# Patient Record
Sex: Female | Born: 1960 | Race: Black or African American | Hispanic: No | Marital: Single | Smoking: Current every day smoker
Health system: Southern US, Community
[De-identification: ages and names within clinical notes are randomized; demographics above are authoritative.]

## PROBLEM LIST (undated history)

## (undated) DIAGNOSIS — D249 Benign neoplasm of unspecified breast: Secondary | ICD-10-CM

## (undated) DIAGNOSIS — N63 Unspecified lump in unspecified breast: Secondary | ICD-10-CM

## (undated) DIAGNOSIS — J189 Pneumonia, unspecified organism: Secondary | ICD-10-CM

## (undated) HISTORY — DX: Unspecified lump in unspecified breast: N63.0

## (undated) HISTORY — DX: Benign neoplasm of unspecified breast: D24.9

## (undated) HISTORY — DX: Pneumonia, unspecified organism: J18.9

---
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
## 2011-02-09 DIAGNOSIS — N63 Unspecified lump in unspecified breast: Secondary | ICD-10-CM

## 2011-02-09 HISTORY — DX: Unspecified lump in unspecified breast: N63.0

## 2011-02-09 HISTORY — PX: BREAST SURGERY: SHX581

## 2012-01-13 ENCOUNTER — Ambulatory Visit: Payer: Self-pay

## 2012-01-24 ENCOUNTER — Ambulatory Visit: Payer: Self-pay

## 2012-02-07 ENCOUNTER — Ambulatory Visit: Payer: Self-pay | Admitting: General Surgery

## 2012-08-02 ENCOUNTER — Encounter: Payer: Self-pay | Admitting: *Deleted

## 2013-01-16 ENCOUNTER — Ambulatory Visit: Payer: Self-pay | Admitting: *Deleted

## 2013-01-18 ENCOUNTER — Telehealth: Payer: Self-pay

## 2013-01-18 NOTE — Telephone Encounter (Signed)
Spoke with patient and she is having her screening mammogram scheduled through her work on 01/18/13 @ [facility]. She is not sure if she will be able to follow up here with Dr Evette Cristal afterwards due to financial constraints. She will call us to see about following up after she completes her mammogram.

## 2013-07-03 IMAGING — US ULTRASOUND LEFT BREAST
1 series · 13 of 21 positions shown · non-contrast
Comparison: none

REASON FOR EXAM: us lt density Susanth Oh [id number]
COMMENTS:

[Series 1: ultrasound left breast · 0.08mm/px · 13 of 21 slices shown]
[im 1/21]
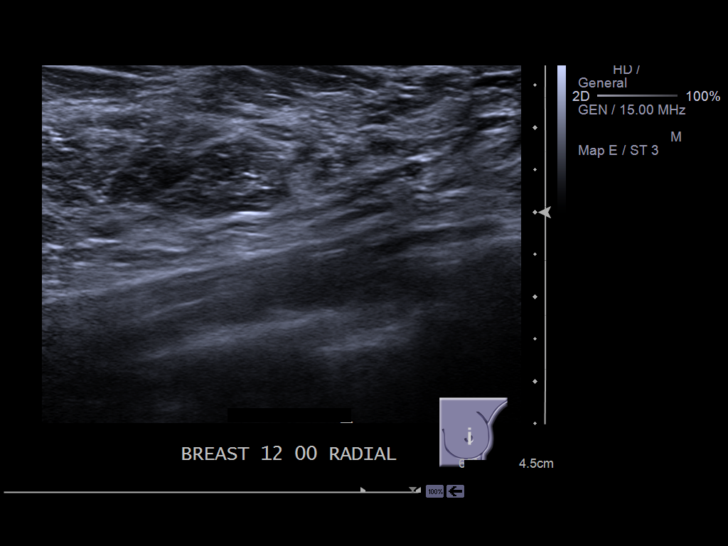
[im 3/21]
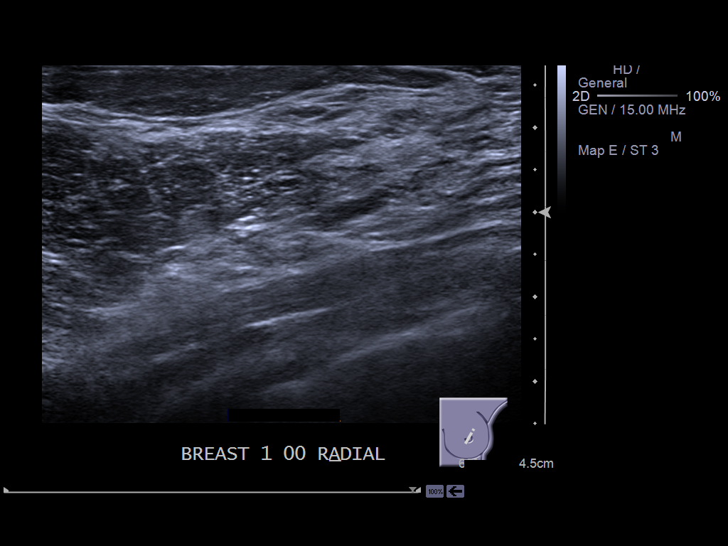
[im 5/21]
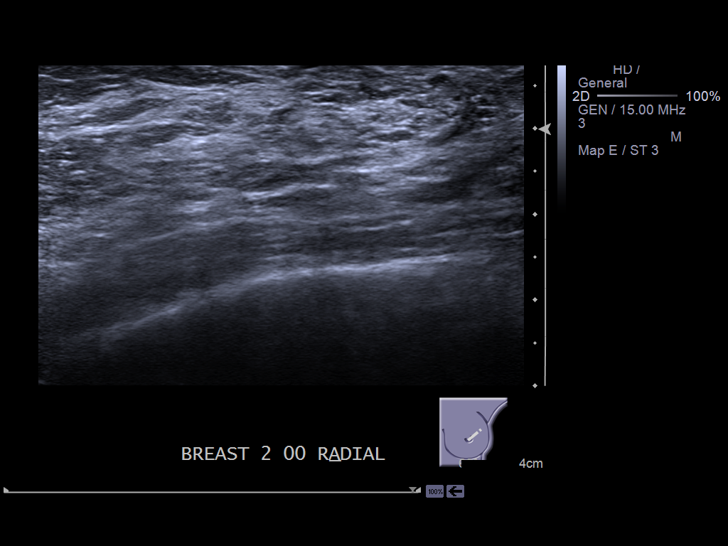
[im 6/21]
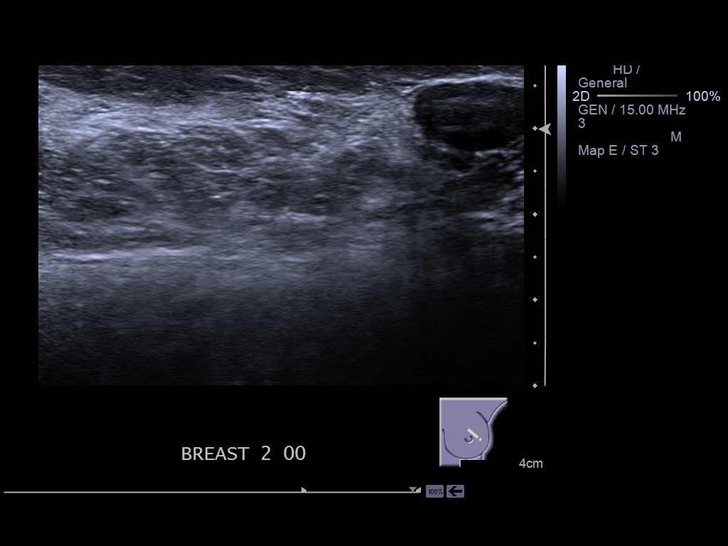
[im 8/21]
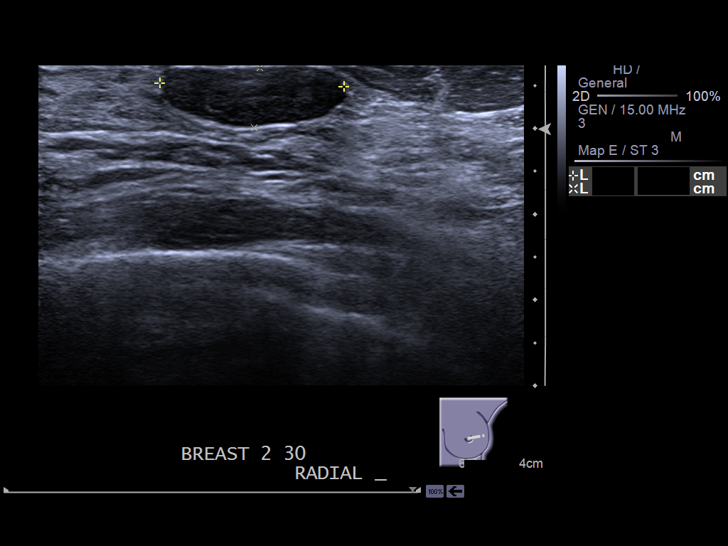
[im 9/21]
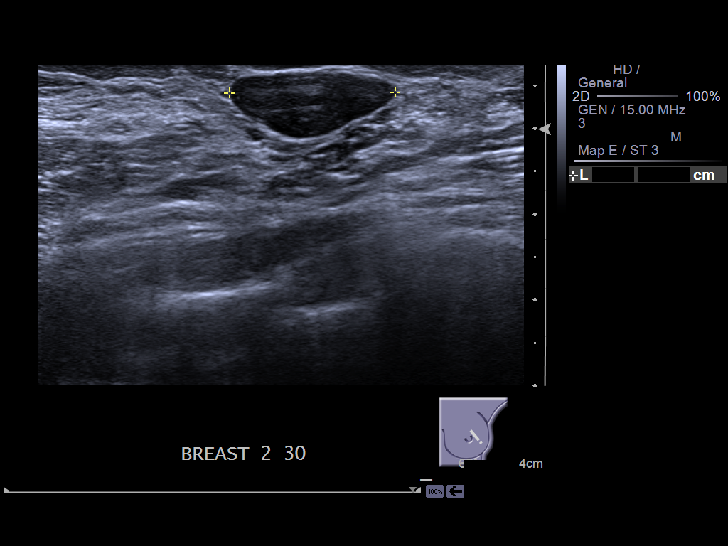
[im 11/21]
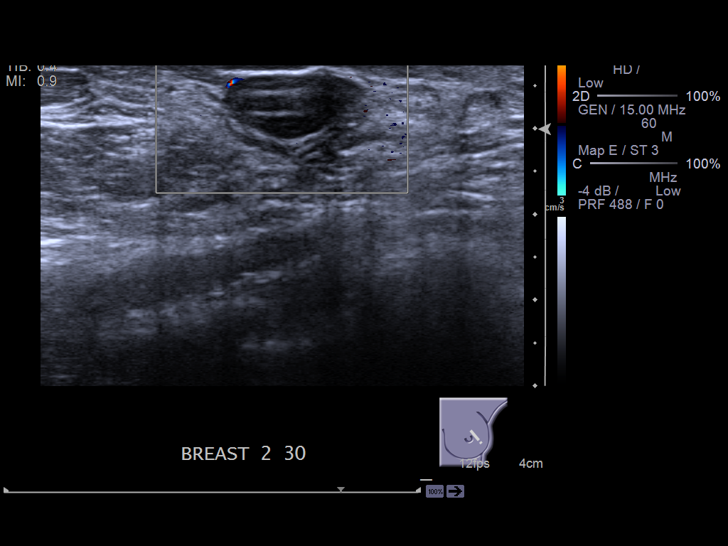
[im 13/21]
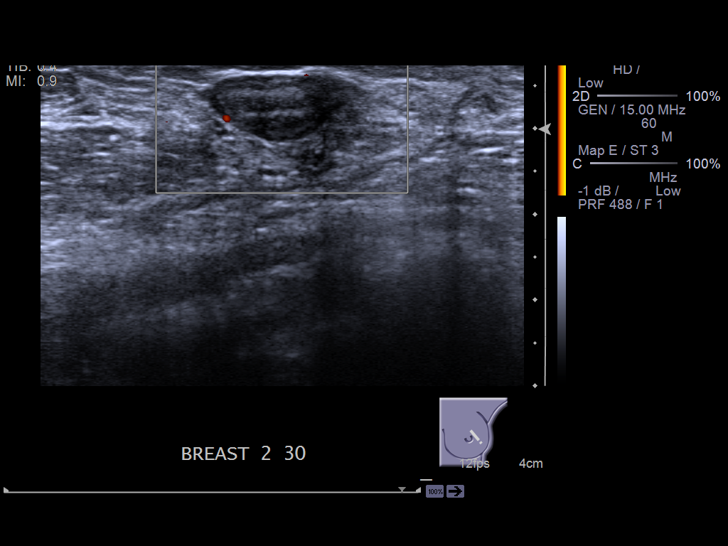
[im 14/21]
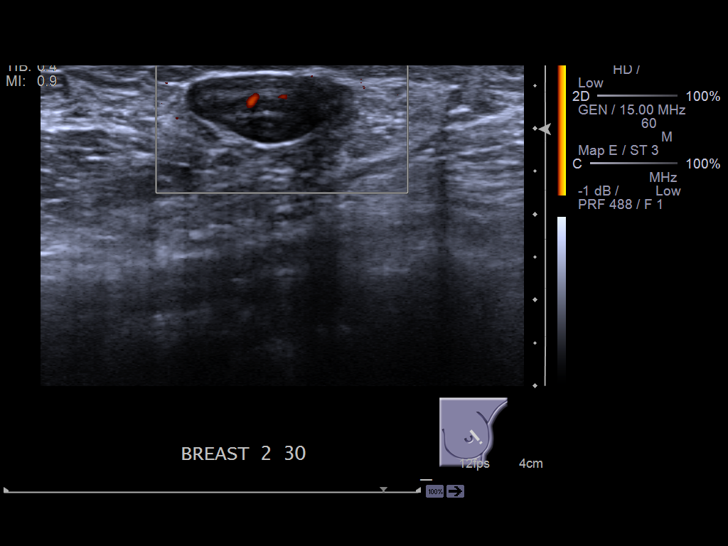
[im 16/21]
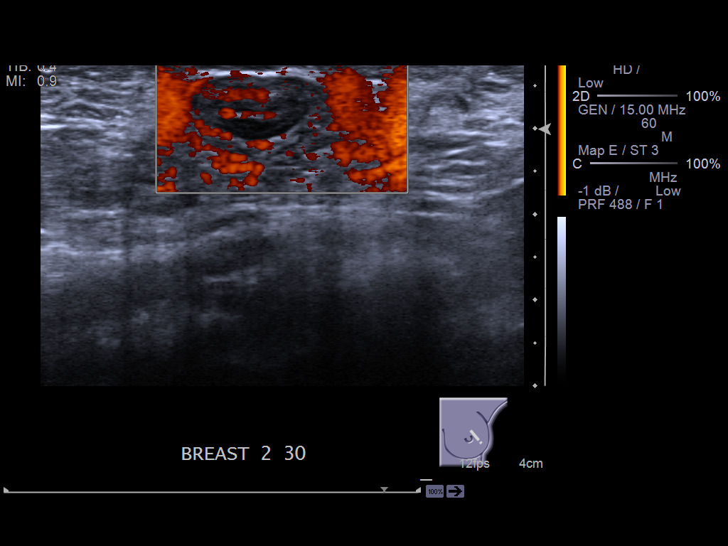
[im 17/21]
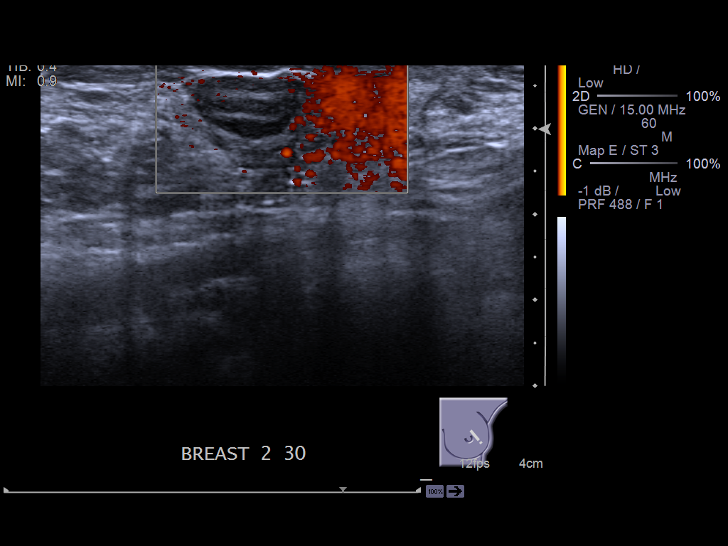
[im 19/21]
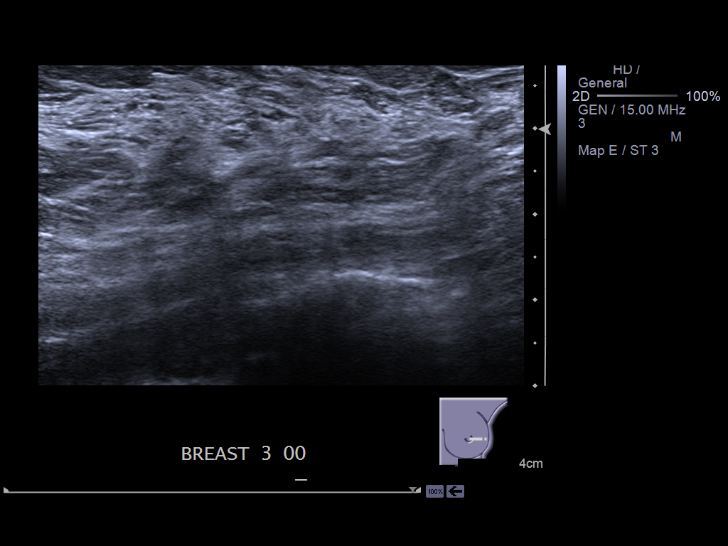
[im 21/21]
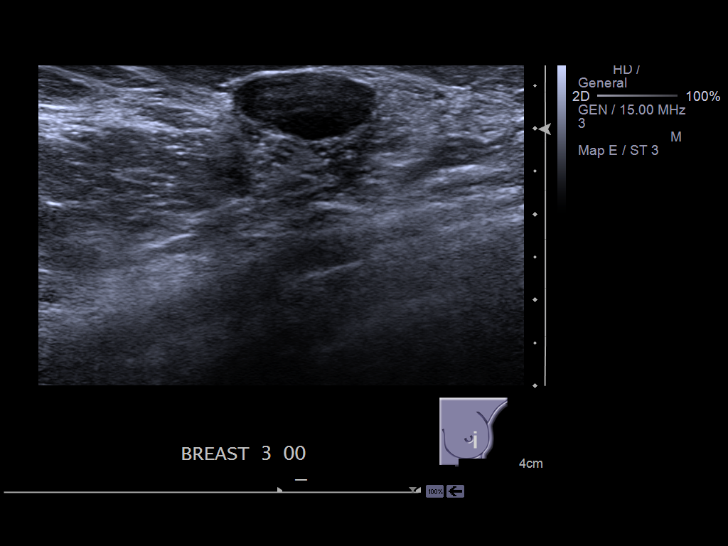

[13 of 21 positions shown; findings below may reference images not displayed]

PROCEDURE:     US  - US BREAST LEFT  - January 24, 2012  [DATE]

RESULT:      The patient underwent screening mammography on January 13, 2012. At that time nodularity was noted anteriorly in the superolateral
periareolar region on the left. The patient was asked to return for directed
ultrasound of the left breast.

At the [DATE] position there is an ovoid hypoechoic mass which measures 2.2 x
0.7 x 1.9 cm. It exhibits a small amount of vascularity. There is subtle
distal shadowing. The finding is nonspecific but does not reflect a simple
cyst.
IMPRESSION: There is an abnormality in the upper outer periareolar
region of the left breast which has indeterminate findings at ultrasound.
This corresponds to the area of increased nodular density mammographically.

BI-RADS 48: Surgical consultation is recommended for this indeterminate mass
quite anteriorly in the left breast.

A NEGATIVE MAMMOGRAM REPORT DOES NOT PRECLUDE BIOPSY OR OTHER EVALUATION OF
A CLINICALLY PALPABLE OR OTHERWISE SUSPICIOUS MASS OR LESION. BREAST CANCER
MAY NOT BE DETECTED BY MAMMOGRAPHY IN UP TO 10% OF CASES.

Thank you for the opportunity to contribute to the care of your patient.
Dictation site:1

Addendum: The designation above should be BI-RADS 4a.

## 2014-05-28 NOTE — Op Note (Signed)
PATIENT NAME:  Ashley Carr, DOUGAN MR#:  [id number] DATE OF BIRTH:  30-Aug-1960  DATE OF PROCEDURE:  02/07/2012  PREOPERATIVE DIAGNOSIS: Left breast fibroadenoma.   POSTOPERATIVE DIAGNOSIS: Left breast fibroadenoma.   PROCEDURE: Excision of left breast mass under intraoperative ultrasound guidance.   SURGEON: Mckinley Jewel, M.D.   ANESTHESIA: Monitored care and local anesthetic using 1% Xylocaine mixed with 0.5% Marcaine.   COMPLICATIONS: None.   ESTIMATED BLOOD LOSS: Minimal.   DRAINS: None.   DESCRIPTION OF PROCEDURE: The patient was placed in the supine position on the operating table. With adequate sedation and monitoring, the left breast area was prepped and draped out as a sterile field. Ultrasound probe with a sterile cover was brought up to the field. The mass in question was identified just outside the areolar margin at about the 2 o'clock position. This was not palpable but was only visible on ultrasound. This area was marked. A local anesthetic was instilled along the areolar margin, at this site, and a skin incision was made. The skin and subcutaneous tissue were then elevated towards the lateral aspect and the glandular tissue exposed and the mass in question was identified. This was an elongated, lobulated mass about 2.5 cm in length. This was completely excised out. Bleeding was controlled with cautery.         The mass was sent fresh for pathologic processing. The deeper tissue was closed with 2-0 Vicryl and the skin was closed with subcuticular 4-0 Vicryl covered with Dermabond. The procedure was well tolerated. She was then returned to the recovery room in stable condition.  ____________________________ S.Robinette Haines, MD sgs:sb D: 02/07/2012 11:53:08 ET T: 02/07/2012 12:04:49 ET JOB#: [id number]  cc: S.G. Jamal Collin, MD, <Dictator> [facility] Robinette Haines MD ELECTRONICALLY SIGNED 02/08/2012 7:16

## 2015-03-19 ENCOUNTER — Encounter: Payer: Self-pay | Admitting: *Deleted

## 2015-03-19 ENCOUNTER — Emergency Department: Payer: BLUE CROSS/BLUE SHIELD

## 2015-03-19 ENCOUNTER — Emergency Department
Admission: EM | Admit: 2015-03-19 | Discharge: 2015-03-19 | Disposition: A | Discharge status: Home / Self Care | Payer: BLUE CROSS/BLUE SHIELD | Attending: Emergency Medicine | Admitting: Emergency Medicine

## 2015-03-19 DIAGNOSIS — J189 Pneumonia, unspecified organism: Secondary | ICD-10-CM | POA: Diagnosis not present

## 2015-03-19 DIAGNOSIS — R079 Chest pain, unspecified: Secondary | ICD-10-CM | POA: Diagnosis present

## 2015-03-19 DIAGNOSIS — Z880 Allergy status to penicillin: Secondary | ICD-10-CM | POA: Insufficient documentation

## 2015-03-19 DIAGNOSIS — J181 Lobar pneumonia, unspecified organism: Secondary | ICD-10-CM

## 2015-03-19 DIAGNOSIS — F17200 Nicotine dependence, unspecified, uncomplicated: Secondary | ICD-10-CM | POA: Insufficient documentation

## 2015-03-19 LAB — CBC
HCT: 41.1 % (ref 35.0–47.0)
Hemoglobin: 14 g/dL (ref 12.0–16.0)
MCH: 29.5 pg (ref 26.0–34.0)
MCHC: 34.1 g/dL (ref 32.0–36.0)
MCV: 86.5 fL (ref 80.0–100.0)
PLATELETS: 200 10*3/uL (ref 150–440)
RBC: 4.76 MIL/uL (ref 3.80–5.20)
RDW: 15.2 % — AB (ref 11.5–14.5)
WBC: 14.6 10*3/uL — AB (ref 3.6–11.0)

## 2015-03-19 LAB — BASIC METABOLIC PANEL
Anion gap: 5 (ref 5–15)
BUN: 10 mg/dL (ref 6–20)
CALCIUM: 10.5 mg/dL — AB (ref 8.9–10.3)
CO2: 25 mmol/L (ref 22–32)
CREATININE: 0.79 mg/dL (ref 0.44–1.00)
Chloride: 106 mmol/L (ref 101–111)
GFR calc Af Amer: >60 mL/min (ref >60)
Glucose, Bld: 100 mg/dL — ABNORMAL HIGH (ref 65–99)
POTASSIUM: 3.5 mmol/L (ref 3.5–5.1)
SODIUM: 136 mmol/L (ref 135–145)

## 2015-03-19 LAB — TROPONIN I

## 2015-03-19 MED ORDER — LEVOFLOXACIN 750 MG PO TABS
750.0000 mg | ORAL_TABLET | Freq: Once | ORAL | Status: AC
  Administered 2015-03-19: 750 mg via ORAL
  Filled 2015-03-19: qty 1

## 2015-03-19 MED ORDER — LEVOFLOXACIN 750 MG PO TABS: 750.0000 mg | ORAL_TABLET | Freq: Every day | ORAL | Status: AC

## 2015-03-19 NOTE — ED Notes (Signed)
Pt states some chest pain that began yesterday, mid chest, denies any radiation, nausea or vomitng or SOB, pt states she feels like she is catching a chest cold, states productive cough

## 2015-03-19 NOTE — Discharge Instructions (Signed)

## 2015-03-19 NOTE — ED Provider Notes (Signed)
[facility] Emergency Department Provider Note  Time seen: 1:54 PM  I have reviewed the triage vital signs and the nursing notes.   HISTORY  Chief Complaint Chest Pain    HPI Ashley Carr is a 55 y.o. female with a past medical history of pneumonia in the past, presents the emergency department with 3 days of chest discomfort, cough with yellow sputum production. Denies fever or chills. Denies any chest pain at rest but states she does have mid to right-sided chest pain when coughing or taking a deep breath. Denies any leg pain or swelling. Describes her chest pain as gone currently, moderate when coughing. Dull in quality.     Past Medical History  Diagnosis Date  . Benign neoplasm of breast   . Lump or mass in breast 2013    LEFT  BREAST  . Pneumonia 1980    There are no active problems to display for this patient.   Past Surgical History  Procedure Laterality Date  . Breast surgery Left 2013    mass excision    No current outpatient prescriptions on file.  Allergies Penicillins  History reviewed. No pertinent family history.  Social History Social History  Substance Use Topics  . Smoking status: Current Every Day Smoker -- 1.00 packs/day for 20 years  . Smokeless tobacco: None  . Alcohol Use: No    Review of Systems Constitutional: Negative for fever. Eyes: Negative for visual changes. ENT: Negative for congestion Cardiovascular: Negative for chest pain. Respiratory: Negative for shortness of breath. Gastrointestinal: Negative for abdominal pain, vomiting and diarrhea. Genitourinary: Negative for dysuria. Musculoskeletal: Negative for back pain. Skin: Negative for rash. Neurological: Negative for headaches, focal weakness or numbness.  10-point ROS otherwise negative.  ____________________________________________   PHYSICAL EXAM:  VITAL SIGNS: ED Triage Vitals  Enc Vitals Group     BP 03/19/15 1141 124/53 mmHg   Pulse Rate 03/19/15 1141 61     Resp 03/19/15 1141 18     Temp 03/19/15 1141 98.9 F (37.2 C)     Temp Source 03/19/15 1141 Oral     SpO2 03/19/15 1141 99 %     Weight 03/19/15 1141 180 lb (81.647 kg)     Height 03/19/15 1141 5\' 8"  (1.727 m)     Head Cir --      Peak Flow --      Pain Score 03/19/15 1142 3     Pain Loc --      Pain Edu? --      Excl. in [place]? --    Constitutional: Alert and oriented. Well appearing and in no distress. Eyes: Normal exam ENT   Head: Normocephalic and atraumatic.   Mouth/Throat: Mucous membranes are moist. Cardiovascular: Normal rate, regular rhythm. No murmur Respiratory: Normal respiratory effort without tachypnea nor retractions. Mild rhonchi auscultated in the right lower lung fields. No wheeze. Gastrointestinal: Soft and nontender. No distention.   Musculoskeletal: Nontender with normal range of motion in all extremities. No lower extremity tenderness. Neurologic:  Normal speech and language. No gross focal neurologic deficits Skin:  Skin is warm, dry and intact.  Psychiatric: Mood and affect are normal. Speech and behavior are normal.   ____________________________________________    EKG  EKG reviewed and interpreted by myself shows normal sinus rhythm at 66 bpm, narrow QRS, normal axis, normal intervals, no ST changes. Normal EKG.  ____________________________________________    RADIOLOGY  Rt lower lobe opacity.   INITIAL IMPRESSION / ASSESSMENT AND PLAN /  ED COURSE  Pertinent labs & imaging results that were available during my care of the patient were reviewed by me and considered in my medical decision making (see chart for details).  Right lower lobe opacity on chest x-ray, labs show a mild leukocytosis. Troponin negative, labs within normal limits including 99% oxygen saturation on room air, afebrile. We will treat the patient with 7 days of Levaquin, and have her follow-up with a primary care physician for recheck. I  discussed return precautions which patient is agreeable.  ____________________________________________   FINAL CLINICAL IMPRESSION(S) / ED DIAGNOSES  Right lower lobe pneumonia   Harvest Dark, MD 03/19/15 1356

## 2016-08-26 IMAGING — CR DG CHEST 2V
1 series · 2 of 2 positions shown · non-contrast
Comparison: None.

CLINICAL DATA: Chest pain, productive cough

EXAM:
CHEST  2 VIEW

[Series 1: dg chest 2 view · 0.14mm/px · 2 of 2 slices shown]
[im 1/2]
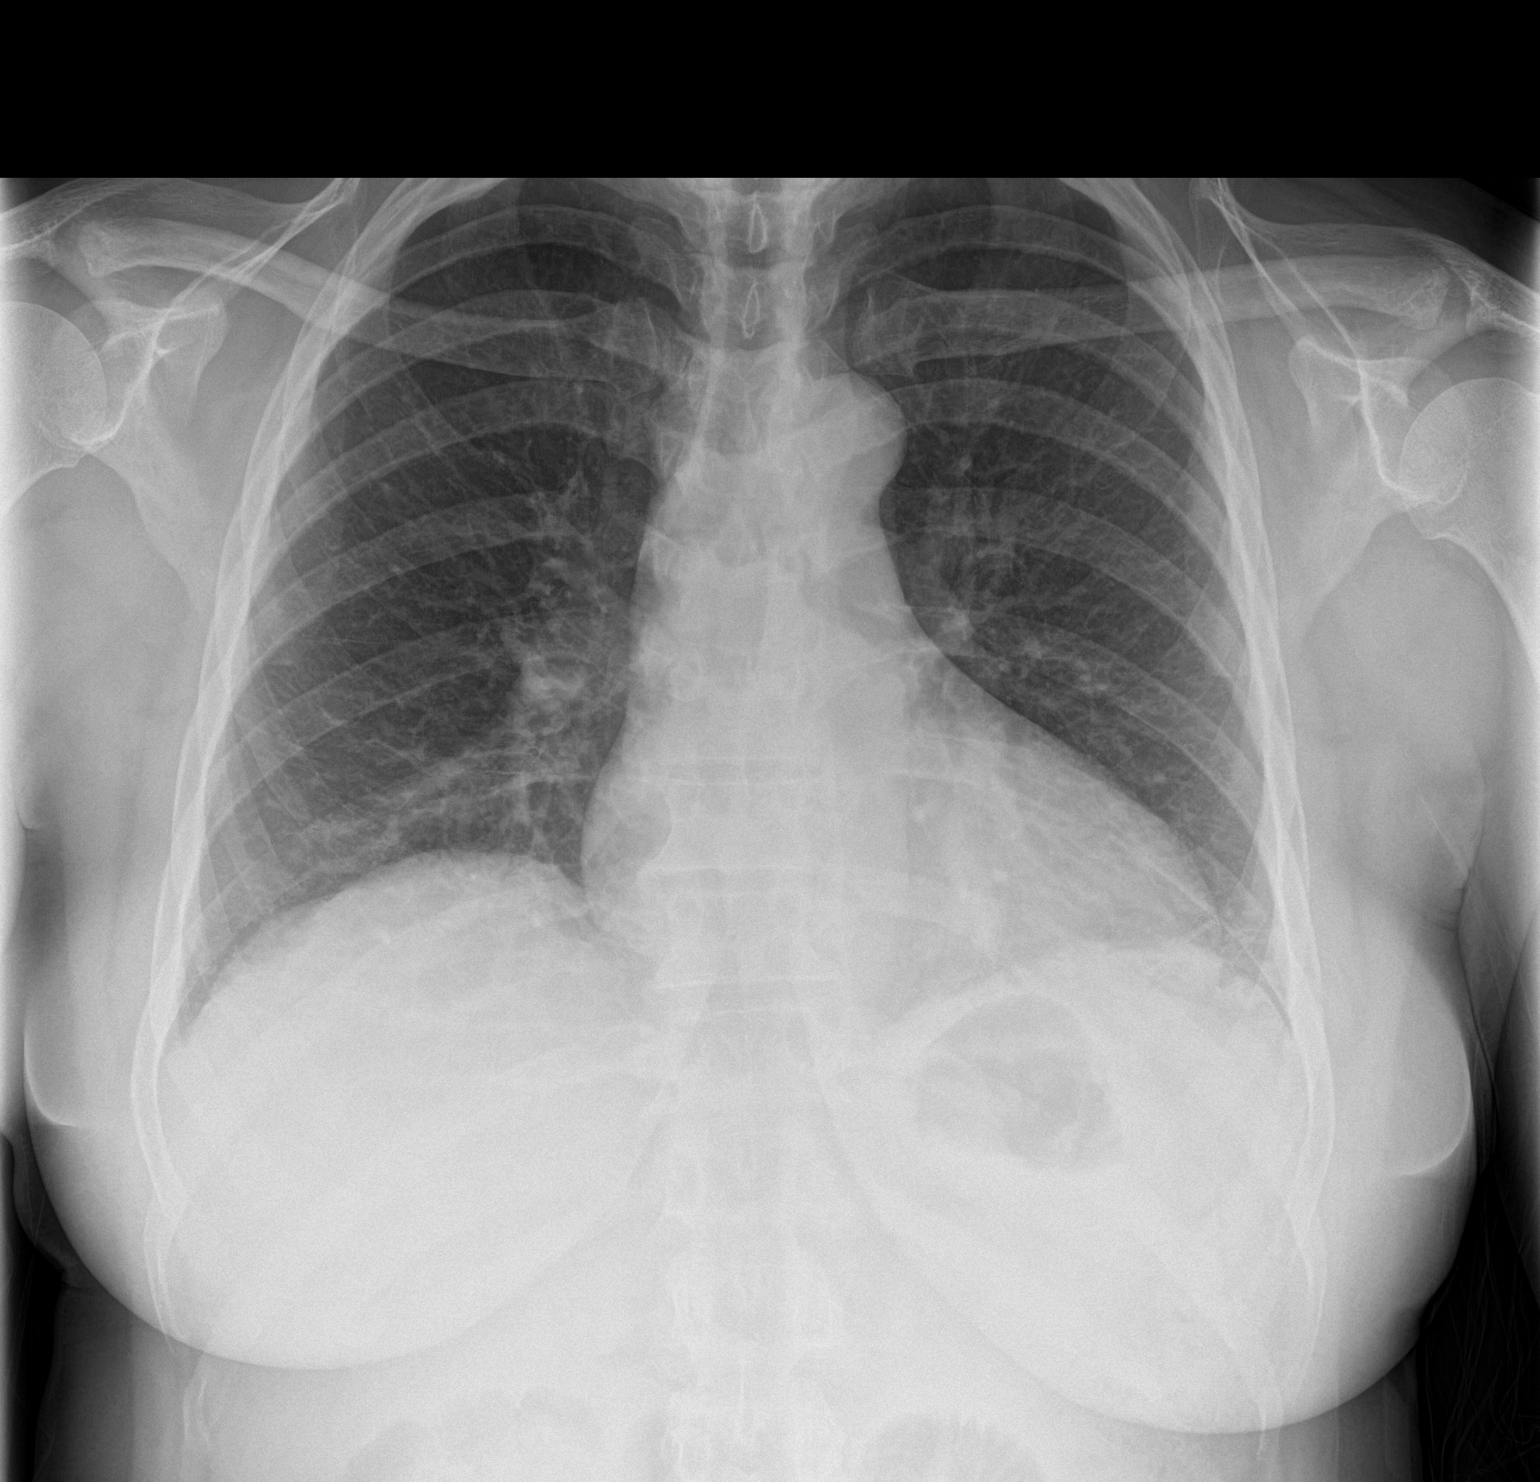
[im 2/2]
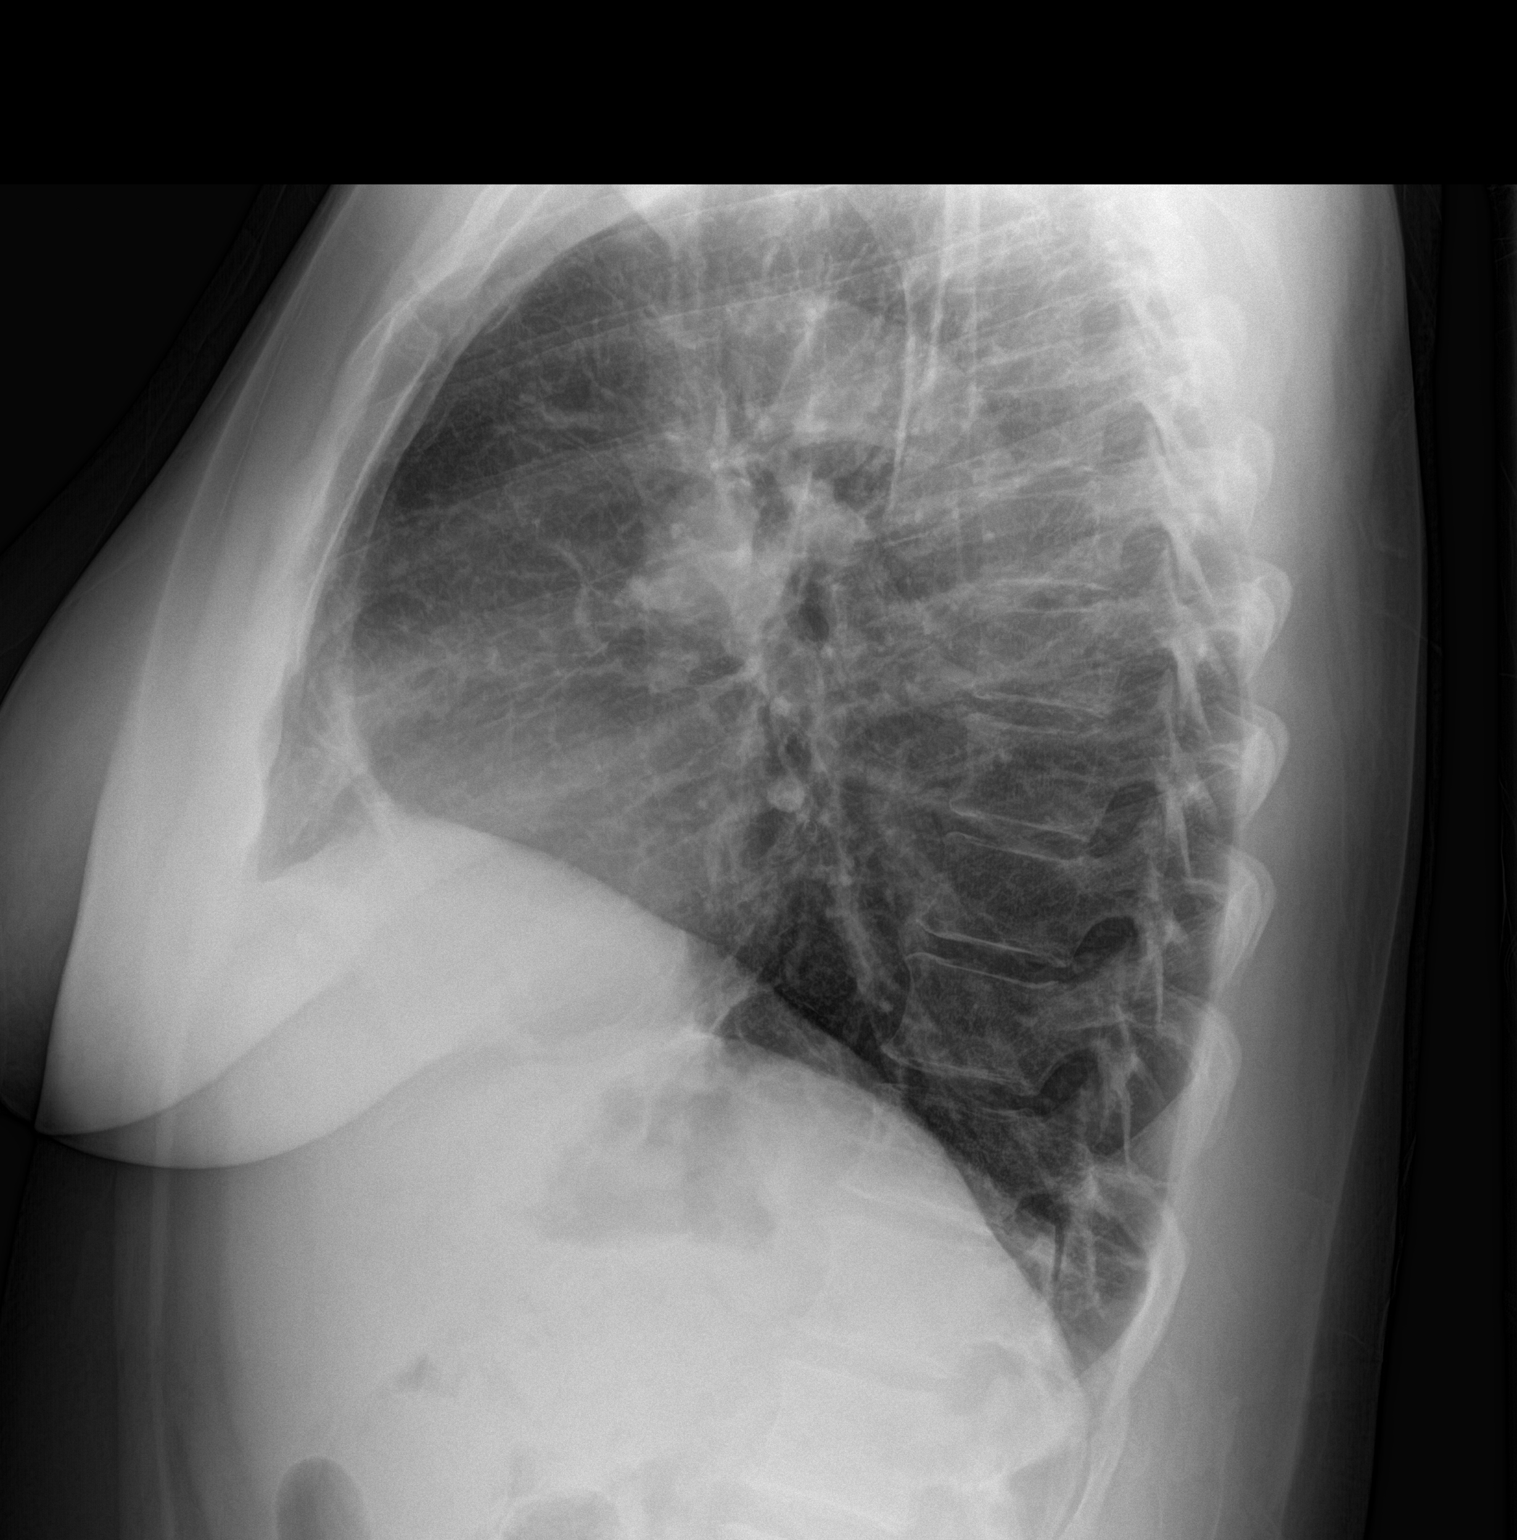

[2 of 2 positions shown; findings below may reference images not displayed]

FINDINGS: Mild hazy right lower lobe airspace disease. There is no pleural
effusion or pneumothorax. The heart and mediastinal contours are
unremarkable.

The osseous structures are unremarkable.
IMPRESSION: Hazy right lower lobe airspace disease concerning for atelectasis
versus pneumonia.

## 2016-12-24 ENCOUNTER — Other Ambulatory Visit: Payer: Self-pay | Admitting: Family Medicine

## 2016-12-24 DIAGNOSIS — Z1231 Encounter for screening mammogram for malignant neoplasm of breast: Secondary | ICD-10-CM

## 2017-03-22 ENCOUNTER — Other Ambulatory Visit: Payer: Self-pay

## 2018-10-19 ENCOUNTER — Encounter: Payer: Self-pay | Admitting: *Deleted

## 2018-10-19 ENCOUNTER — Telehealth: Payer: Self-pay | Admitting: Gastroenterology

## 2018-10-19 ENCOUNTER — Other Ambulatory Visit: Payer: Self-pay

## 2018-10-19 DIAGNOSIS — R195 Other fecal abnormalities: Secondary | ICD-10-CM

## 2018-10-19 DIAGNOSIS — Z1211 Encounter for screening for malignant neoplasm of colon: Secondary | ICD-10-CM

## 2018-10-19 NOTE — Telephone Encounter (Signed)
Gastroenterology Pre-Procedure Review  Request Date: Monday 10/30/2018    [facility] Requesting Physician: Dr. Marius Ditch   PATIENT REVIEW QUESTIONS: The patient responded to the following health history questions as indicated:    1. Are you having any GI issues? no 2. Do you have a personal history of Polyps? no 3. Do you have a family history of Colon Cancer or Polyps? no 4. Diabetes Mellitus? no 5. Joint replacements in the past 12 months?no 6. Major health problems in the past 3 months?no 7. Any artificial heart valves, MVP, or defibrillator?no    MEDICATIONS & ALLERGIES:    Patient reports the following regarding taking any anticoagulation/antiplatelet therapy:   Plavix, Coumadin, Eliquis, Xarelto, Lovenox, Pradaxa, Brilinta, or Effient? no Aspirin? no  Patient confirms/reports the following medications:  No current outpatient medications on file.   No current facility-administered medications for this visit.     Patient confirms/reports the following allergies:  Allergies  Allergen Reactions  . Penicillins     No orders of the defined types were placed in this encounter.   AUTHORIZATION INFORMATION Primary Insurance: 1D#: Group #:  Secondary Insurance: 1D#: Group #:  SCHEDULE INFORMATION: Date:  Time: Location:

## 2018-10-26 ENCOUNTER — Other Ambulatory Visit: Payer: Self-pay

## 2018-10-26 ENCOUNTER — Other Ambulatory Visit
Admission: RE | Admit: 2018-10-26 | Discharge: 2018-10-26 | Disposition: A | Discharge status: Home / Self Care | Payer: BC Managed Care – PPO | Source: Ambulatory Visit | Attending: Gastroenterology | Admitting: Gastroenterology

## 2018-10-26 DIAGNOSIS — Z20828 Contact with and (suspected) exposure to other viral communicable diseases: Secondary | ICD-10-CM | POA: Diagnosis not present

## 2018-10-26 DIAGNOSIS — Z01812 Encounter for preprocedural laboratory examination: Secondary | ICD-10-CM | POA: Diagnosis not present

## 2018-10-27 ENCOUNTER — Other Ambulatory Visit: Payer: Self-pay | Admitting: Family Medicine

## 2018-10-27 DIAGNOSIS — Z0000 Encounter for general adult medical examination without abnormal findings: Secondary | ICD-10-CM

## 2018-10-27 LAB — SARS CORONAVIRUS 2 (TAT 6-24 HRS): SARS Coronavirus 2: NEGATIVE

## 2018-10-30 ENCOUNTER — Ambulatory Visit: Payer: BC Managed Care – PPO | Admitting: Certified Registered"

## 2018-10-30 ENCOUNTER — Encounter
Admission: RE | Disposition: A | Discharge status: Home / Self Care | Payer: Self-pay | Source: Home / Self Care | Attending: Gastroenterology

## 2018-10-30 ENCOUNTER — Ambulatory Visit
Admission: RE | Admit: 2018-10-30 | Discharge: 2018-10-30 | Disposition: A | Discharge status: Home / Self Care | Payer: BC Managed Care – PPO | Attending: Gastroenterology | Admitting: Gastroenterology

## 2018-10-30 ENCOUNTER — Other Ambulatory Visit: Payer: Self-pay

## 2018-10-30 ENCOUNTER — Encounter: Payer: Self-pay | Admitting: Anesthesiology

## 2018-10-30 DIAGNOSIS — K635 Polyp of colon: Secondary | ICD-10-CM | POA: Diagnosis not present

## 2018-10-30 DIAGNOSIS — K621 Rectal polyp: Secondary | ICD-10-CM | POA: Diagnosis not present

## 2018-10-30 DIAGNOSIS — R195 Other fecal abnormalities: Secondary | ICD-10-CM

## 2018-10-30 DIAGNOSIS — F17210 Nicotine dependence, cigarettes, uncomplicated: Secondary | ICD-10-CM | POA: Diagnosis not present

## 2018-10-30 DIAGNOSIS — Z1211 Encounter for screening for malignant neoplasm of colon: Secondary | ICD-10-CM

## 2018-10-30 DIAGNOSIS — D120 Benign neoplasm of cecum: Secondary | ICD-10-CM | POA: Insufficient documentation

## 2018-10-30 HISTORY — PX: COLONOSCOPY WITH PROPOFOL: SHX5780

## 2018-10-30 SURGERY — COLONOSCOPY WITH PROPOFOL
Anesthesia: General

## 2018-10-30 MED ORDER — SODIUM CHLORIDE 0.9 % IV SOLN
INTRAVENOUS | Status: DC
  Administered 2018-10-30: 11:00:00 via INTRAVENOUS

## 2018-10-30 MED ORDER — PROPOFOL 10 MG/ML IV BOLUS
INTRAVENOUS | Status: DC | PRN
  Administered 2018-10-30: 50 mg via INTRAVENOUS

## 2018-10-30 MED ORDER — PROPOFOL 500 MG/50ML IV EMUL
INTRAVENOUS | Status: DC | PRN
  Administered 2018-10-30: 150 ug/kg/min via INTRAVENOUS

## 2018-10-30 MED ORDER — PROPOFOL 500 MG/50ML IV EMUL
INTRAVENOUS | Status: AC
  Filled 2018-10-30: qty 50

## 2018-10-30 MED ORDER — PROPOFOL 10 MG/ML IV BOLUS
INTRAVENOUS | Status: AC
  Filled 2018-10-30: qty 20

## 2018-10-30 NOTE — Anesthesia Preprocedure Evaluation (Signed)
Anesthesia Evaluation  Patient identified by MRN, date of birth, ID band Patient awake    Reviewed: Allergy & Precautions, NPO status , Patient's Chart, lab work & pertinent test results, reviewed documented beta blocker date and time   Airway Mallampati: III  TM Distance: >3 FB     Dental  (+) Chipped   Pulmonary pneumonia, resolved, Current Smoker,           Cardiovascular      Neuro/Psych    GI/Hepatic   Endo/Other    Renal/GU      Musculoskeletal   Abdominal   Peds  Hematology   Anesthesia Other Findings   Reproductive/Obstetrics                             Anesthesia Physical Anesthesia Plan  ASA: III  Anesthesia Plan: General   Post-op Pain Management:    Induction: Intravenous  PONV Risk Score and Plan:   Airway Management Planned:   Additional Equipment:   Intra-op Plan:   Post-operative Plan:   Informed Consent: I have reviewed the patients History and Physical, chart, labs and discussed the procedure including the risks, benefits and alternatives for the proposed anesthesia with the patient or authorized representative who has indicated his/her understanding and acceptance.       Plan Discussed with: CRNA  Anesthesia Plan Comments:         Anesthesia Quick Evaluation

## 2018-10-30 NOTE — Op Note (Signed)
[facility] Patient Name: Ashley Carr Procedure Date: 10/30/2018 11:58 AM MRN: [id number] Account #: [id number] Date of Birth: [birth year] Admit Type: Outpatient Age: 58 Room: [facility] ENDO ROOM 2 Gender: Female Note Status: Finalized Procedure:            Colonoscopy Indications:          This is the patient's first colonoscopy, Heme positive                        stool Providers:            Lin Landsman MD, MD Referring MD:         [facility]Barton Dubois (Referring MD) Medicines:            Monitored Anesthesia Care Complications:        No immediate complications. Estimated blood loss: None. Procedure:            Pre-Anesthesia Assessment:                       - Prior to the procedure, a History and Physical was                        performed, and patient medications and allergies were                        reviewed. The patient is competent. The risks and                        benefits of the procedure and the sedation options and                        risks were discussed with the patient. All questions                        were answered and informed consent was obtained.                        Patient identification and proposed procedure were                        verified by the physician, the nurse, the                        anesthesiologist, the anesthetist and the technician in                        the pre-procedure area in the procedure room in the                        endoscopy suite. Mental Status Examination: alert and                        oriented. Airway Examination: normal oropharyngeal                        airway and neck mobility. Respiratory Examination:                        clear to auscultation. CV Examination: normal.  Prophylactic Antibiotics: The patient does not require                        prophylactic antibiotics. Prior Anticoagulants: The    patient has taken no previous anticoagulant or                        antiplatelet agents. ASA Grade Assessment: II - A                        patient with mild systemic disease. After reviewing the                        risks and benefits, the patient was deemed in                        satisfactory condition to undergo the procedure. The                        anesthesia plan was to use monitored anesthesia care                        (MAC). Immediately prior to administration of                        medications, the patient was re-assessed for adequacy                        to receive sedatives. The heart rate, respiratory rate,                        oxygen saturations, blood pressure, adequacy of                        pulmonary ventilation, and response to care were                        monitored throughout the procedure. The physical status                        of the patient was re-assessed after the procedure.                       After obtaining informed consent, the colonoscope was                        passed under direct vision. Throughout the procedure,                        the patient's blood pressure, pulse, and oxygen                        saturations were monitored continuously. The                        Colonoscope was introduced through the anus and                        advanced to the the cecum, identified by appendiceal  orifice and ileocecal valve. The colonoscopy was                        performed without difficulty. The patient tolerated the                        procedure well. The quality of the bowel preparation                        was evaluated using the BBPS [facility] Bowel Preparation                        Scale) with scores of: Right Colon = 3, Transverse                        Colon = 3 and Left Colon = 3 (entire mucosa seen well                        with no residual staining, small fragments of stool or                         opaque liquid). The total BBPS score equals 9. Findings:      The perianal and digital rectal examinations were normal. Pertinent       negatives include normal sphincter tone and no palpable rectal lesions.      A 2 mm polyp was found in the cecum. The polyp was sessile. The polyp       was removed with a cold biopsy forceps. Resection and retrieval were       complete.      Two sessile polyps were found in the rectum. The polyps were 5 mm in       size. These polyps were removed with a cold snare. Resection and       retrieval were complete.      The exam was otherwise without abnormality. Impression:           - One 2 mm polyp in the cecum, removed with a cold                        biopsy forceps. Resected and retrieved.                       - Two 5 mm polyps in the rectum, removed with a cold                        snare. Resected and retrieved.                       - The examination was otherwise normal. Recommendation:       - Discharge patient to home (with escort).                       - Resume previous diet today.                       - Continue present medications.                       - Await pathology results.                       -  Repeat colonoscopy in 7-10 years for surveillance                        based on pathology results. Procedure Code(s):    --- Professional ---                       [phone], Colonoscopy, flexible; with removal of tumor(s),                        polyp(s), or other lesion(s) by snare technique                       45380, 59, Colonoscopy, flexible; with biopsy, single                        or multiple Diagnosis Code(s):    --- Professional ---                       K63.5, Polyp of colon                       K62.1, Rectal polyp                       R19.5, Other fecal abnormalities CPT copyright 2019 American Medical Association. All rights reserved. The codes documented in this report are preliminary and upon coder review  may  be revised to meet current compliance requirements. Dr. Ulyess Mort Lin Landsman MD, MD 10/30/2018 12:30:00 PM This report has been signed electronically. Number of Addenda: 0 Note Initiated On: 10/30/2018 11:58 AM Scope Withdrawal Time: 0 hours 11 minutes 10 seconds  Total Procedure Duration: 0 hours 13 minutes 59 seconds  Estimated Blood Loss: Estimated blood loss: none.      [facility]

## 2018-10-30 NOTE — H&P (Signed)
Cephas Darby, MD [address]  [place]  [place], [place] [address]  Main: [phone]  Fax: [phone] Pager: [phone]  Primary Care Physician:  [facility] [place] Primary Gastroenterologist:  Dr. Cephas Darby  Pre-Procedure History & Physical: HPI:  Ashley Carr is a 58 y.o. female is here for an colonoscopy.   Past Medical History:  Diagnosis Date  . Benign neoplasm of breast   . Lump or mass in breast 2013   LEFT  BREAST  . Pneumonia 1980    Past Surgical History:  Procedure Laterality Date  . BREAST SURGERY Left 2013   mass excision    Prior to Admission medications   Medication Sig Start Date End Date Taking? Authorizing Provider  naproxen sodium (ALEVE) 220 MG tablet Take 220 mg by mouth.   Yes [provider]    Allergies as of 10/19/2018 - Review Complete 03/19/2015  Allergen Reaction Noted  . Penicillins  03/19/2015    History reviewed. No pertinent family history.  Social History   Socioeconomic History  . Marital status: Single    Spouse name: Not on file  . Number of children: Not on file  . Years of education: Not on file  . Highest education level: Not on file  Occupational History  . Not on file  Social Needs  . Financial resource strain: Not on file  . Food insecurity    Worry: Not on file    Inability: Not on file  . Transportation needs    Medical: Not on file    Non-medical: Not on file  Tobacco Use  . Smoking status: Current Every Day Smoker    Packs/day: 0.50    Years: 20.00    Pack years: 10.00    Types: Cigarettes  . Smokeless tobacco: Never Used  Substance and Sexual Activity  . Alcohol use: No  . Drug use: No  . Sexual activity: Not on file  Lifestyle  . Physical activity    Days per week: Not on file    Minutes per session: Not on file  . Stress: Not on file  Relationships  . Social Herbalist on phone: Not on file    Gets together: Not on file    Attends  religious service: Not on file    Active member of club or organization: Not on file    Attends meetings of clubs or organizations: Not on file    Relationship status: Not on file  . Intimate partner violence    Fear of current or ex partner: Not on file    Emotionally abused: Not on file    Physically abused: Not on file    Forced sexual activity: Not on file  Other Topics Concern  . Not on file  Social History Narrative  . Not on file    Review of Systems: See HPI, otherwise negative ROS  Physical Exam: BP 122/71   Pulse (!) 55   Temp (!) 96 F (35.6 C) (Tympanic)   Resp 16   Ht 5' 5.5" (1.664 m)   Wt 81.6 kg   LMP 10/29/2017 (Exact Date)   SpO2 100%   BMI 29.50 kg/m  General:   Alert,  pleasant and cooperative in NAD Head:  Normocephalic and atraumatic. Neck:  Supple; no masses or thyromegaly. Lungs:  Clear throughout to auscultation.    Heart:  Regular rate and rhythm. Abdomen:  Soft, nontender and nondistended. Normal bowel sounds, without guarding,  and without rebound.   Neurologic:  Alert and  oriented x4;  grossly normal neurologically.  Impression/Plan: Orel Riga is here for an colonoscopy to be performed for heme positive stool  Risks, benefits, limitations, and alternatives regarding  colonoscopy have been reviewed with the patient.  Questions have been answered.  All parties agreeable.   Sherri Sear, MD  10/30/2018, 11:30 AM

## 2018-10-30 NOTE — Anesthesia Post-op Follow-up Note (Signed)
Anesthesia QCDR form completed.        

## 2018-10-30 NOTE — Transfer of Care (Signed)
Immediate Anesthesia Transfer of Care Note  Patient: Ashley Carr  Procedure(s) Performed: COLONOSCOPY WITH PROPOFOL (N/A )  Patient Location: Endoscopy Unit  Anesthesia Type:General  Level of Consciousness: awake, alert  and oriented  Airway & Oxygen Therapy: Patient Spontanous Breathing and Patient connected to nasal cannula oxygen  Post-op Assessment: Report given to RN and Post -op Vital signs reviewed and stable  Post vital signs: Reviewed and stable  Last Vitals:  Vitals Value Taken Time  BP    Temp    Pulse    Resp    SpO2      Last Pain:  Vitals:   10/30/18 1041  TempSrc: Tympanic  PainSc: 0-No pain         Complications: No apparent anesthesia complications

## 2018-10-30 NOTE — Anesthesia Postprocedure Evaluation (Signed)
Anesthesia Post Note  Patient: Ashley Carr  Procedure(s) Performed: COLONOSCOPY WITH PROPOFOL (N/A )  Patient location during evaluation: Endoscopy Anesthesia Type: General Level of consciousness: awake and alert Pain management: pain level controlled Vital Signs Assessment: post-procedure vital signs reviewed and stable Respiratory status: spontaneous breathing, nonlabored ventilation, respiratory function stable and patient connected to nasal cannula oxygen Cardiovascular status: blood pressure returned to baseline and stable Postop Assessment: no apparent nausea or vomiting Anesthetic complications: no     Last Vitals:  Vitals:   10/30/18 1252 10/30/18 1301  BP: 125/73 122/67  Pulse:    Resp:    Temp:    SpO2:      Last Pain:  Vitals:   10/30/18 1301  TempSrc:   PainSc: 0-No pain                 Caliya Narine S

## 2018-10-31 ENCOUNTER — Encounter: Payer: Self-pay | Admitting: Gastroenterology

## 2018-10-31 LAB — SURGICAL PATHOLOGY

## 2018-11-01 ENCOUNTER — Encounter: Payer: Self-pay | Admitting: Gastroenterology

## 2018-12-09 ENCOUNTER — Other Ambulatory Visit: Payer: Self-pay | Admitting: Family Medicine

## 2018-12-09 DIAGNOSIS — N950 Postmenopausal bleeding: Secondary | ICD-10-CM

## 2018-12-14 ENCOUNTER — Ambulatory Visit
Admission: RE | Admit: 2018-12-14 | Discharge: 2018-12-14 | Disposition: A | Discharge status: Home / Self Care | Payer: BC Managed Care – PPO | Source: Ambulatory Visit | Attending: Family Medicine | Admitting: Family Medicine

## 2018-12-14 DIAGNOSIS — N950 Postmenopausal bleeding: Secondary | ICD-10-CM | POA: Insufficient documentation

## 2020-05-23 IMAGING — US US PELVIS COMPLETE WITH TRANSVAGINAL
1 series · 13 of 25 positions shown · non-contrast
Comparison: None

CLINICAL DATA: Postmenopausal bleeding

EXAM:
TRANSABDOMINAL AND TRANSVAGINAL ULTRASOUND OF PELVIS
TECHNIQUE: Both transabdominal and transvaginal ultrasound examinations of the
pelvis were performed. Transabdominal technique was performed for
global imaging of the pelvis including uterus, ovaries, adnexal
regions, and pelvic cul-de-sac. It was necessary to proceed with
endovaginal exam following the transabdominal exam to visualize the
endometrium and ovaries.

[Series 1: us pelvis complete with transvaginal · 13 of 41 slices shown]
[im 1/41]
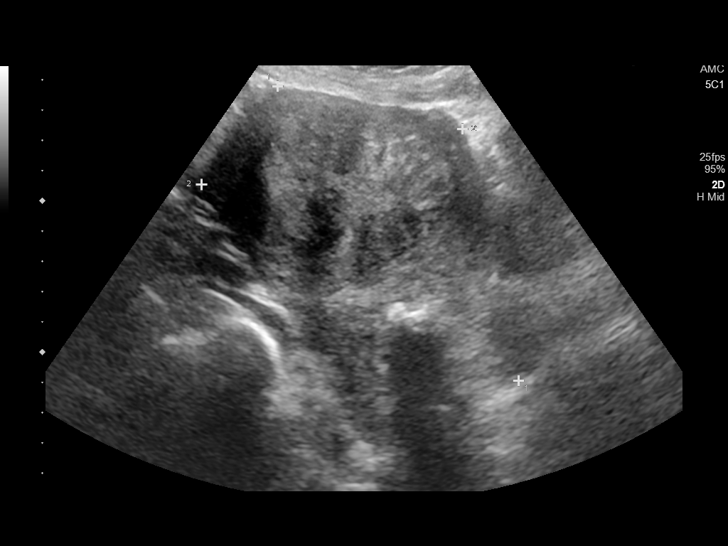
[im 4/41]
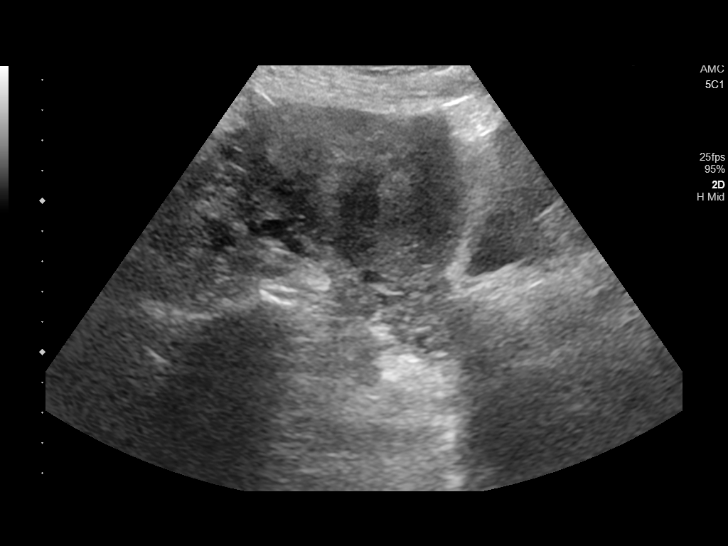
[im 7/41]
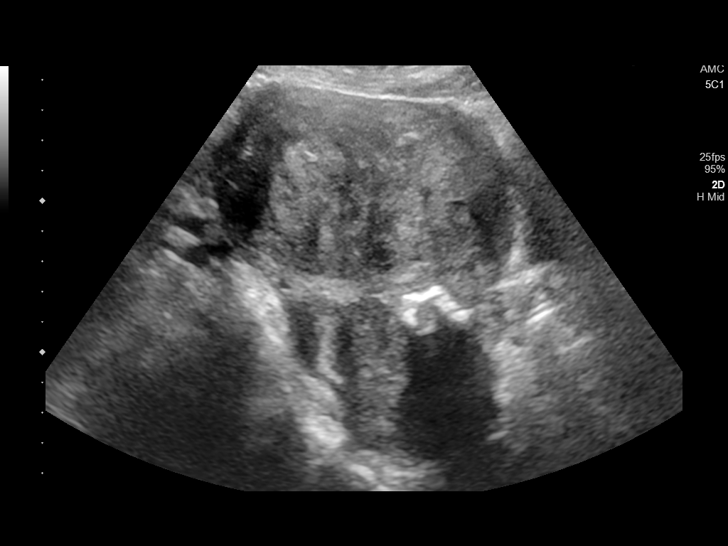
[im 11/41]
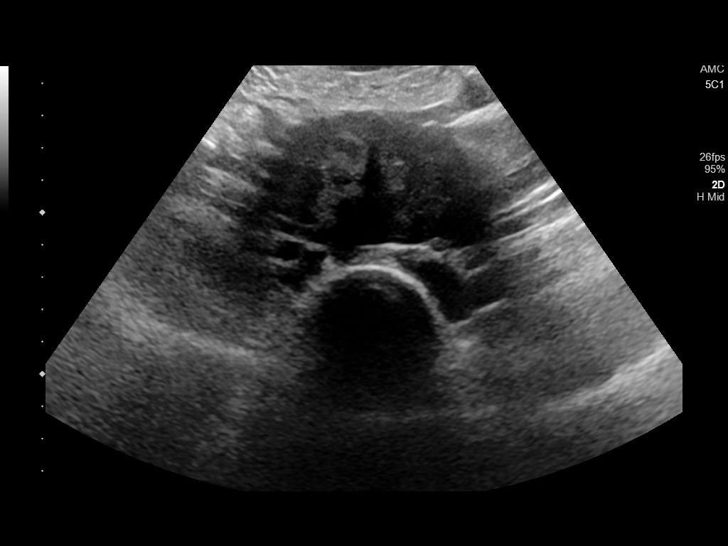
[im 14/41]
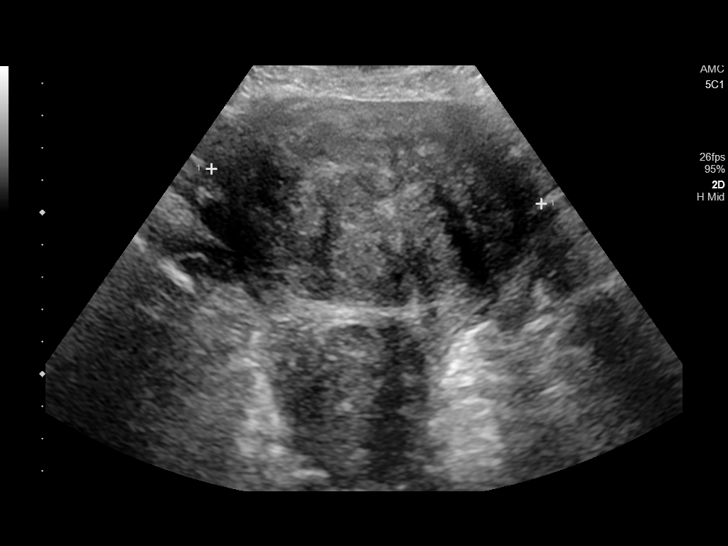
[im 17/41]
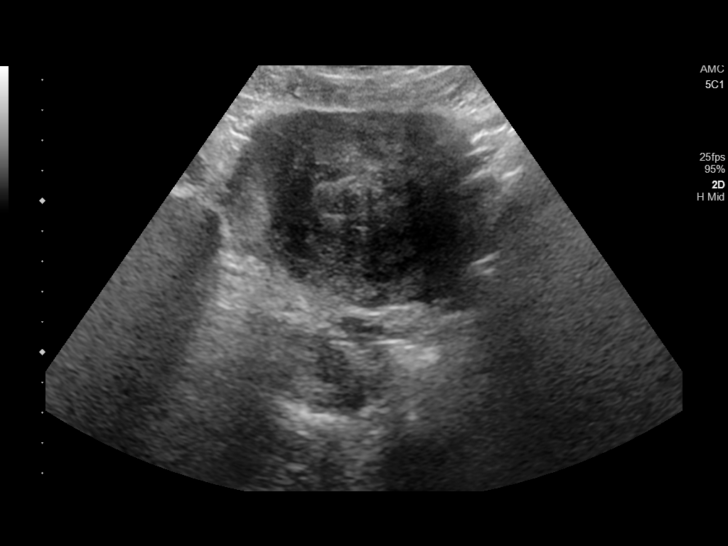
[im 21/41]
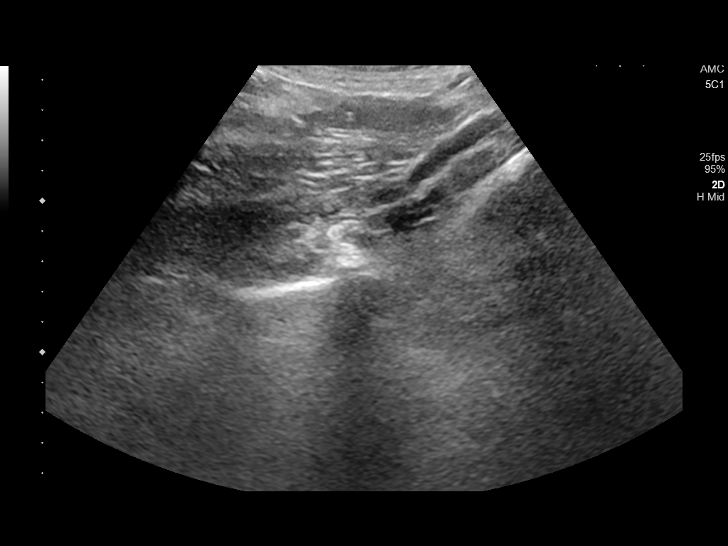
[im 24/41]
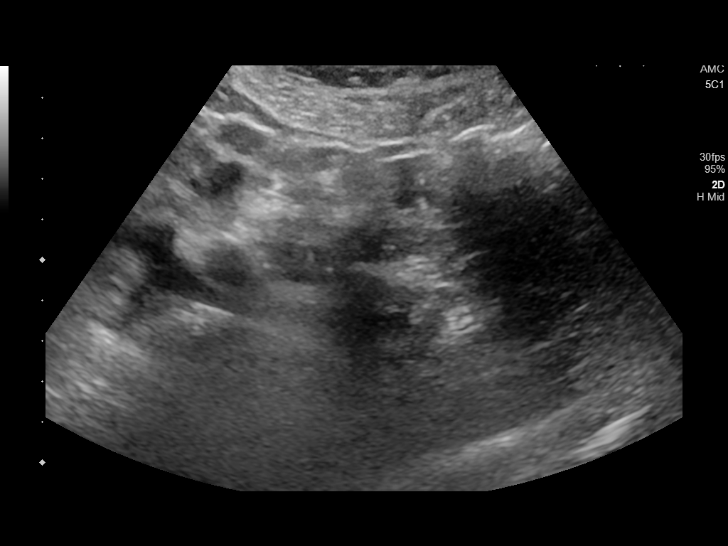
[im 27/41]
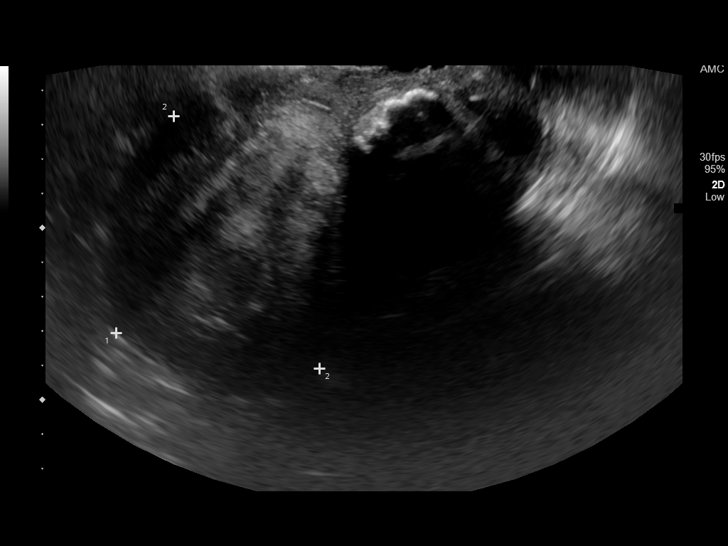
[im 31/41]
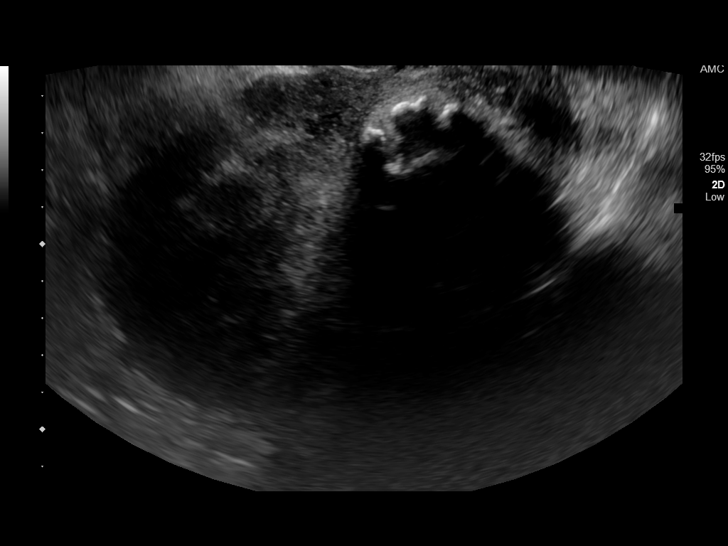
[im 34/41]
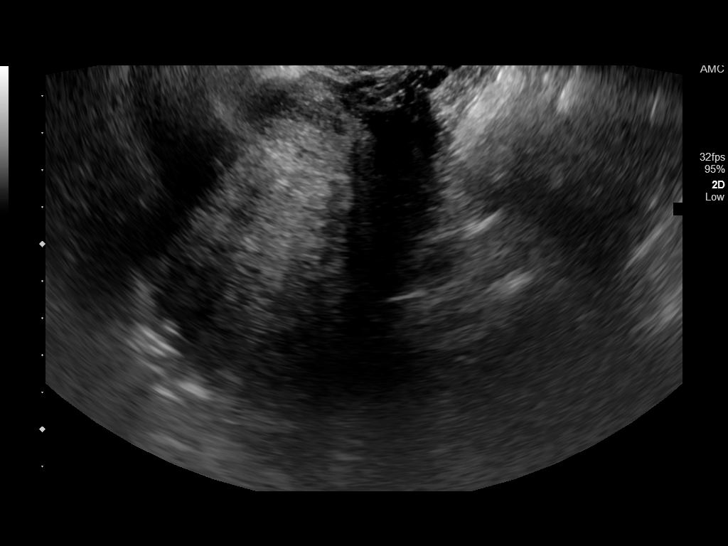
[im 37/41]
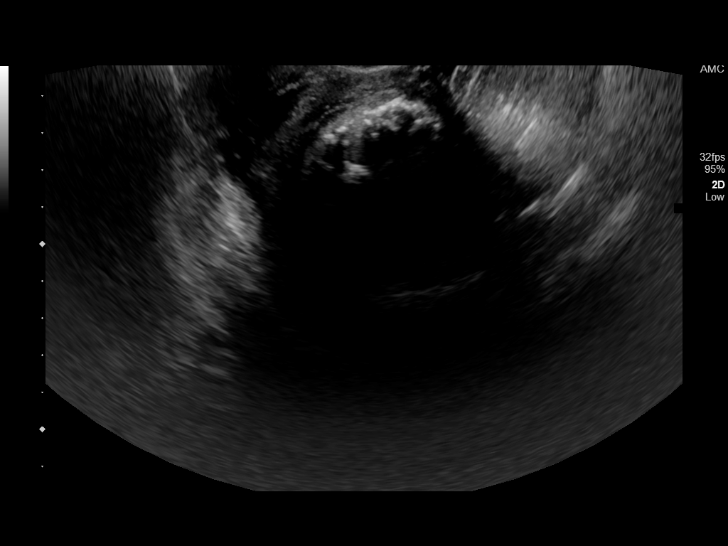
[im 41/41]
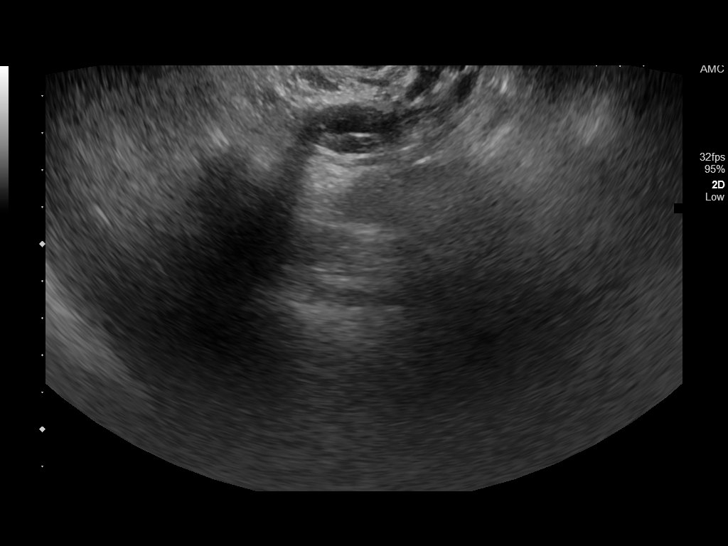

[13 of 25 positions shown; findings below may reference images not displayed]

FINDINGS: Uterus

Measurements: 12.6 x 8.8 x 10.3 cm = volume: 596 mL. Large leiomyoma
at posterior upper uterus centrally 7.7 x 8.4 x 8.8 cm, extending
submucosal displacing endometrial complex anteriorly. Additional
partially calcified calcified leiomyoma at mid to lower RIGHT uterus
6.1 x 5.4 x 5.6 cm.

Endometrium

Thickness: 5 mm.  Trace endometrial fluid without focal mass

Right ovary

Not visualized on either transabdominal or endovaginal imaging,
likely obscured by bowel

Left ovary

Not visualized on either transabdominal or endovaginal imaging,
likely obscured by bowel

Other findings

No free pelvic fluid.  No adnexal masses.
IMPRESSION: 2 large uterine leiomyomata, the larger of which at 8.8 cm greatest
size extends submucosal at the posterior upper uterus.

Minimal thickening of the endometrial complex, 5 mm thick; in the
setting of post-menopausal bleeding, endometrial sampling is
indicated to exclude carcinoma. If results are benign,
sonohysterogram should be considered for focal lesion work-up. (Ref:
Radiological Reasoning: Algorithmic Workup of Abnormal Vaginal
Bleeding with Endovaginal Sonography and Sonohysterography. AJR
[id number]; 191:S68-73)

## 2024-01-17 ENCOUNTER — Emergency Department: Admission: EM | Admit: 2024-01-17 | Discharge: 2024-01-17 | Disposition: A | Discharge status: Home / Self Care

## 2024-01-17 ENCOUNTER — Emergency Department

## 2024-01-17 ENCOUNTER — Other Ambulatory Visit: Payer: Self-pay

## 2024-01-17 DIAGNOSIS — N390 Urinary tract infection, site not specified: Secondary | ICD-10-CM

## 2024-01-17 DIAGNOSIS — M549 Dorsalgia, unspecified: Secondary | ICD-10-CM

## 2024-01-17 LAB — URINALYSIS, ROUTINE W REFLEX MICROSCOPIC
Bilirubin Urine: NEGATIVE
Glucose, UA: NEGATIVE mg/dL
Hgb urine dipstick: NEGATIVE
Ketones, ur: NEGATIVE mg/dL
Nitrite: NEGATIVE
Protein, ur: NEGATIVE mg/dL
Specific Gravity, Urine: 1.026 (ref 1.005–1.030)
pH: 5 (ref 5.0–8.0)

## 2024-01-17 MED ORDER — CEFDINIR 300 MG PO CAPS: 300.0000 mg | ORAL_CAPSULE | Freq: Two times a day (BID) | ORAL | 0 refills | Status: AC

## 2024-01-17 NOTE — Discharge Instructions (Addendum)
 Follow-up with [facility] if any continued problems.  Use warm moist compresses or heat to your muscles in your back as needed.  Continue the meloxicam that was written for you at the [facility].  The baclofen that was written is a muscle relaxant and could cause drowsiness.  This medication should be taken when you are at home.  The meloxicam should not cause any drowsiness.  A prescription for antibiotics for your urinary tract infection was sent to the pharmacy.  Take the entire course even if you start feeling much better.  Increase fluids to stay hydrated.  If additional pain medication is needed you may use Tylenol.

## 2024-01-17 NOTE — ED Notes (Signed)
 See triage note  Presents with pain to mid back  States pain is non radiating  Denies any fever,cough,urinary sx's or trauma

## 2024-01-17 NOTE — ED Provider Notes (Signed)
 [facility] Provider Note    Event Date/Time   First MD Initiated Contact with Patient 01/17/24 [phone]     (approximate)   History   Back Pain   HPI  Ashley Carr is a 63 y.o. female   presents to the ED with complaint of back pain for the last 2 months.  Patient denies any injury.  She states that in certain movements her back pain is exacerbated.  No relief with Tylenol as she has not taken any anti-inflammatories.  She denies any urinary symptoms or history of kidney stones.      Physical Exam   Triage Vital Signs: ED Triage Vitals  Encounter Vitals Group     BP 01/17/24 0936 (!) 148/73     Girls Systolic BP Percentile --      Girls Diastolic BP Percentile --      Boys Systolic BP Percentile --      Boys Diastolic BP Percentile --      Pulse Rate 01/17/24 0935 75     Resp 01/17/24 0935 16     Temp 01/17/24 0935 98 F (36.7 C)     Temp src --      SpO2 01/17/24 0935 99 %     Weight 01/17/24 0935 190 lb (86.2 kg)     Height 01/17/24 0935 5' 8 (1.727 m)     Head Circumference --      Peak Flow --      Pain Score 01/17/24 0935 0     Pain Loc --      Pain Education --      Exclude from Growth Chart --     Most recent vital signs: Vitals:   01/17/24 0935 01/17/24 0936  BP:  (!) 148/73  Pulse: 75   Resp: 16   Temp: 98 F (36.7 C)   SpO2: 99%      General: Awake, no distress.  CV:  Good peripheral perfusion.  Heart regular rate rhythm. Resp:  Normal effort.  Lungs clear bilaterally. Abd:  No distention.  Other:  No point tenderness on palpation of the thoracic spine or step-offs are appreciated.  There is some tenderness to the left paravertebral muscles approximately T8-T12 area.  Range of motion is slow and guarded.  Patient is able to stand and ambulate without any assistance.  Normal gait was noted.   ED Results / Procedures / Treatments   Labs (all labs ordered are listed, but only abnormal results are  displayed) Labs Reviewed  URINALYSIS, ROUTINE W REFLEX MICROSCOPIC - Abnormal; Notable for the following components:      Result Value   Color, Urine YELLOW (*)    APPearance HAZY (*)    Leukocytes,Ua MODERATE (*)    Bacteria, UA RARE (*)    All other components within normal limits     RADIOLOGY Thoracic spine x-ray images reviewed and interpreted by myself independent of the radiologist.  No acute changes or fracture noted.  Official radiology report is negative for acute changes.    PROCEDURES:  Critical Care performed:   Procedures   MEDICATIONS ORDERED IN ED: Medications - No data to display   IMPRESSION / MDM / ASSESSMENT AND PLAN / ED COURSE  I reviewed the triage vital signs and the nursing notes.   Differential diagnosis includes, but is not limited to, muscle skeletal strain, compression fracture, degenerative disc disease, urinary tract infection, urolithiasis.  63 year old female presents to the emergency department with  complaint of upper back pain without improvement taking baclofen and meloxicam.  She was seen at [facility] and prescribed these medications but admits that she is not taking them on a daily basis.  I explained to her that baclofen could cause drowsiness but the meloxicam could be taken every day.  In addition to her x-ray a urinalysis was obtained and does show that she has 21-50 WBCs.  A prescription for antibiotics was sent to her pharmacy and she is encouraged to continue taking them until completely finished.  She is to follow-up with [facility] if any continued problems.      Patient's presentation is most consistent with acute complicated illness / injury requiring diagnostic workup.  FINAL CLINICAL IMPRESSION(S) / ED DIAGNOSES   Final diagnoses:  Acute urinary tract infection  Mid back pain on left side     Rx / DC Orders   ED Discharge Orders          Ordered    cefdinir  (OMNICEF ) 300 MG capsule  2 times daily         01/17/24 1146             Note:  This document was prepared using Dragon voice recognition software and may include unintentional dictation errors.   Saunders Shona CROME, PA-C 01/17/24 1203    Fernand Rossie HERO, MD 01/17/24 [phone]

## 2024-01-17 NOTE — ED Triage Notes (Signed)
 Pt to ED for mid back pain since October. Pain worsens with change in posture.  Denies injuries.

## 2024-03-08 ENCOUNTER — Other Ambulatory Visit: Payer: Self-pay | Admitting: Family Medicine

## 2024-03-08 DIAGNOSIS — Z1231 Encounter for screening mammogram for malignant neoplasm of breast: Secondary | ICD-10-CM

## 2024-03-22 ENCOUNTER — Encounter
